# Patient Record
Sex: Female | Born: 2000 | Race: White | Hispanic: No | Marital: Single | Smoking: Current every day smoker
Health system: Southern US, Community
[De-identification: ages and names within clinical notes are randomized; demographics above are authoritative.]

## PROBLEM LIST (undated history)

## (undated) HISTORY — PX: TONSILLECTOMY: SUR1361

---
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
## 2020-08-04 IMAGING — DX DG CHEST 2V
2 series · 2 of 2 positions shown · non-contrast
Comparison: 01/13/2018.

CLINICAL DATA: Chills, myalgias and dizziness. Cough.

EXAM:
CHEST - 2 VIEW

[chest pa]
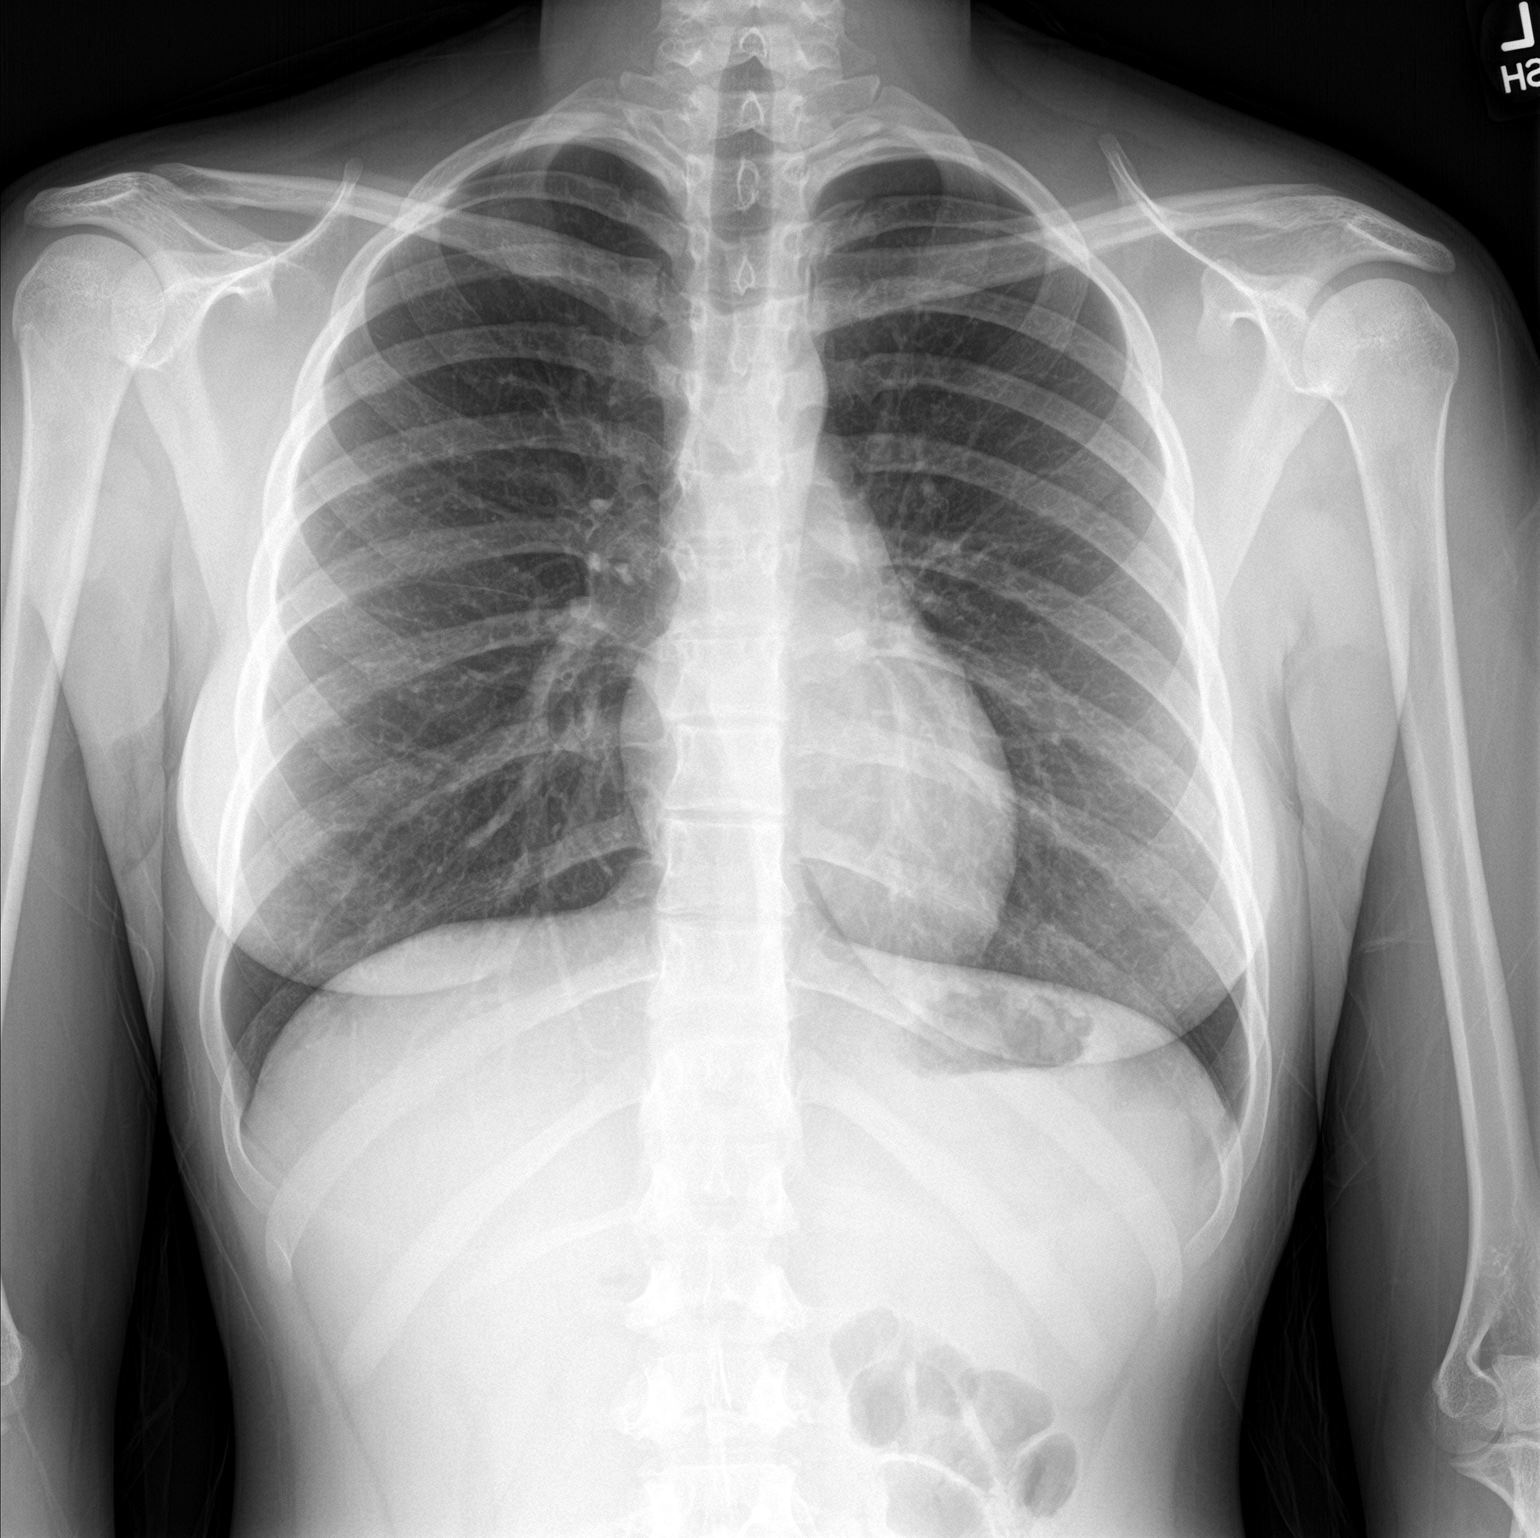

[chest lat]
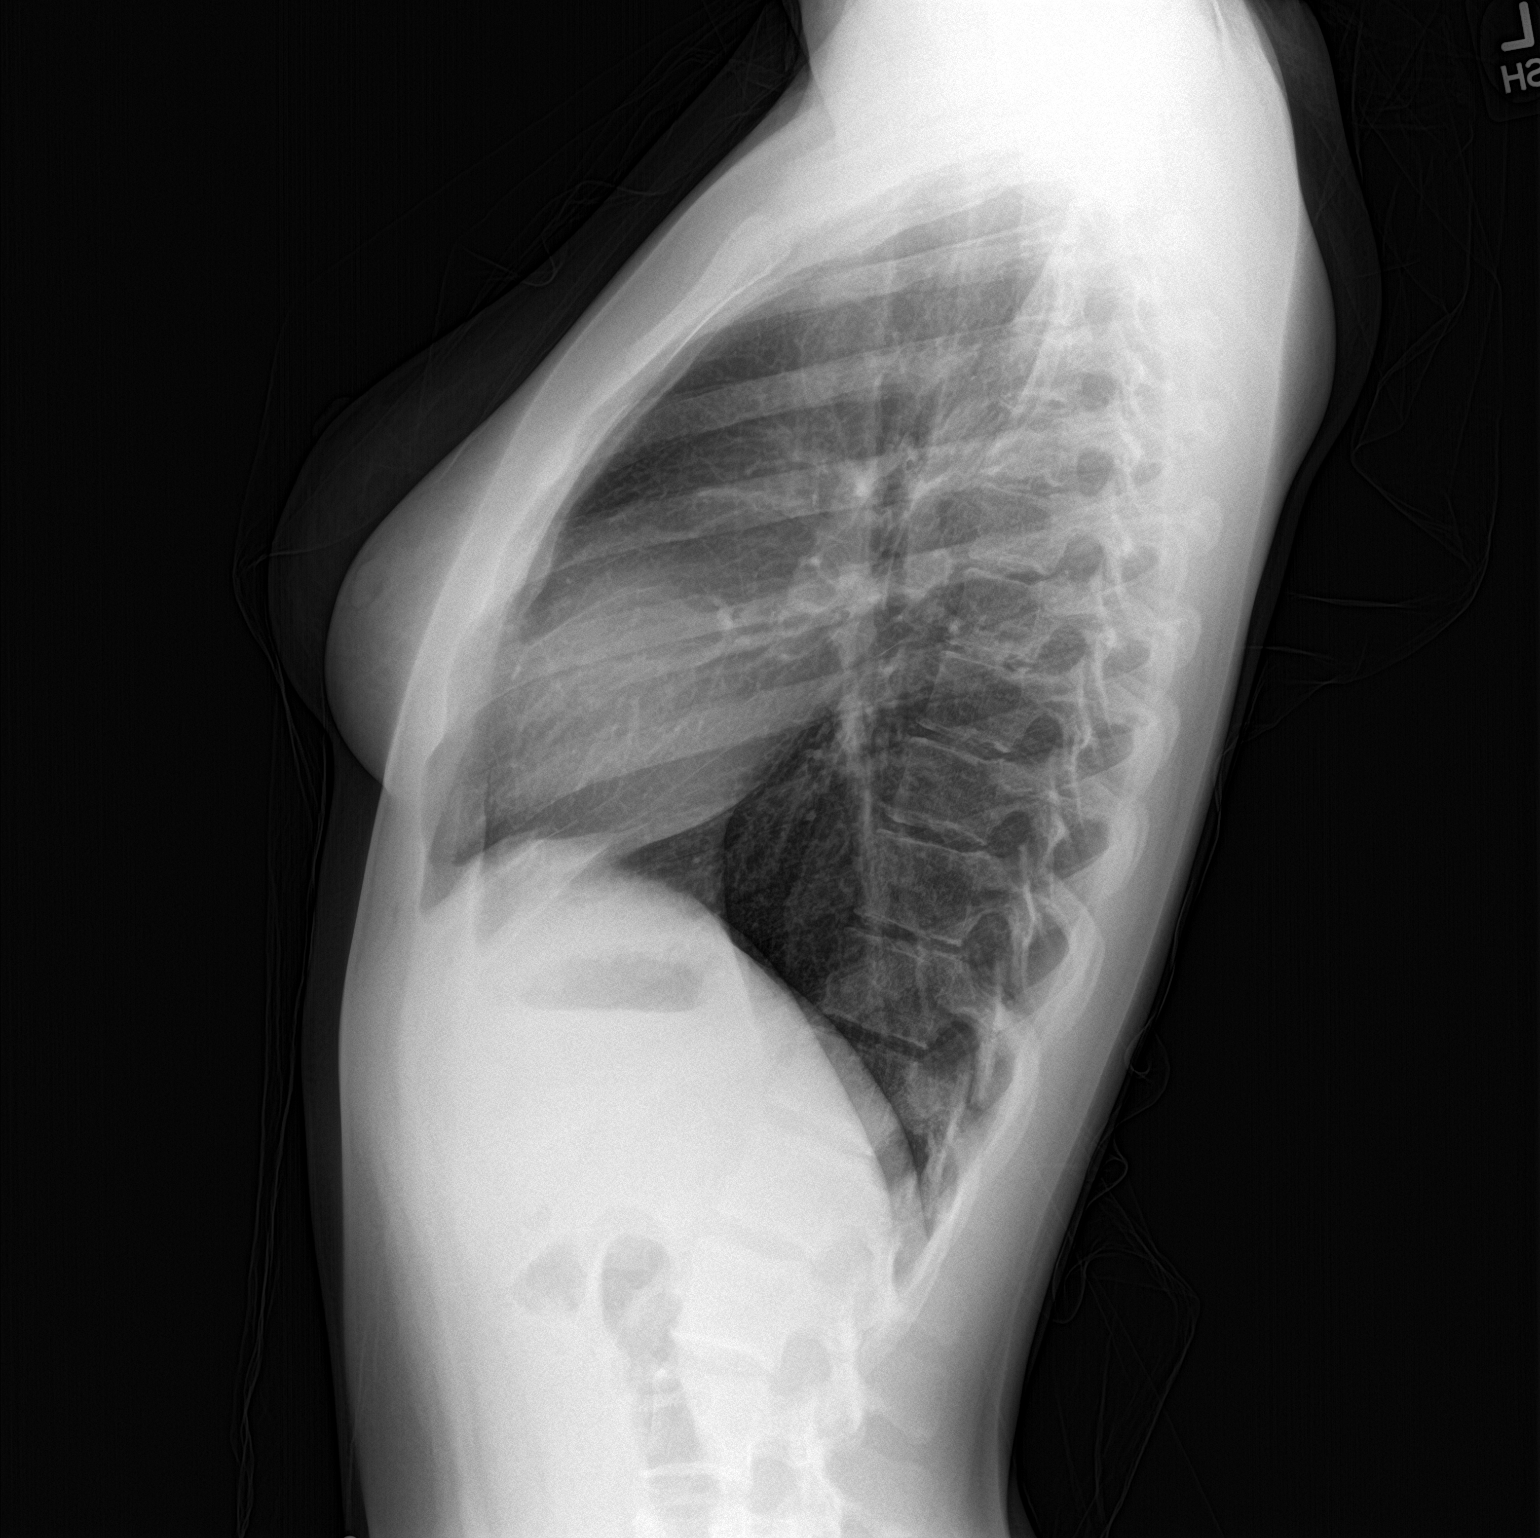

[2 of 2 positions shown; findings below may reference images not displayed]

FINDINGS: Normal sized heart. Clear lungs. Minimal peribronchial thickening
without significant change. Unremarkable bones.
IMPRESSION: Stable minimal bronchitic changes.

## 2020-08-18 ENCOUNTER — Emergency Department (HOSPITAL_BASED_OUTPATIENT_CLINIC_OR_DEPARTMENT_OTHER)
Admission: EM | Admit: 2020-08-18 | Discharge: 2020-08-18 | Disposition: A | Discharge status: Home / Self Care | Payer: Managed Care, Other (non HMO) | Attending: Emergency Medicine | Admitting: Emergency Medicine

## 2020-08-18 ENCOUNTER — Encounter (HOSPITAL_BASED_OUTPATIENT_CLINIC_OR_DEPARTMENT_OTHER): Payer: Self-pay | Admitting: *Deleted

## 2020-08-18 ENCOUNTER — Other Ambulatory Visit: Payer: Self-pay

## 2020-08-18 DIAGNOSIS — H6123 Impacted cerumen, bilateral: Secondary | ICD-10-CM | POA: Insufficient documentation

## 2020-08-18 DIAGNOSIS — F17210 Nicotine dependence, cigarettes, uncomplicated: Secondary | ICD-10-CM | POA: Insufficient documentation

## 2020-08-18 DIAGNOSIS — G8929 Other chronic pain: Secondary | ICD-10-CM | POA: Insufficient documentation

## 2020-08-18 MED ORDER — AZITHROMYCIN 250 MG PO TABS: 250.0000 mg | ORAL_TABLET | Freq: Every day | ORAL | 0 refills | Status: AC

## 2020-08-18 NOTE — ED Notes (Signed)
Pt. C/o her ears feeling full bilat after having a sore throat and sinus issues for a week.

## 2020-08-18 NOTE — Discharge Instructions (Addendum)
You may flush your ears while at home with oxide versus baby oil, as we discussed.  I have written a prescription for antibiotics to take, please take 2 tablets on day 1, 1 tablet for the next 4 days.  You will need to follow-up with an ear nose throat specialist, Dr. Berlinda Last number is attached to your chart, please schedule an appointment for further management.

## 2020-08-18 NOTE — ED Triage Notes (Signed)
Pain in both ears. She woke this am and felt like she was under water. Dizziness.

## 2020-08-18 NOTE — ED Provider Notes (Signed)
[facility] EMERGENCY DEPARTMENT Provider Note   CSN: [id number] Arrival date & time: 08/18/20  1247     History No chief complaint on file.   Natalie Gillespie is a 20 y.o. female.  20 y.o female with no past medical history presents to the ED with a chief complaint of bilateral ear pain for the past 6 months.  Patient reports the symptoms have been ongoing, she has been evaluated several times at urgent care but states "they have been unable to provide any further explanation".  States that this affects her work, she is unable to wear earplugs.  She has been given in the past eardrop, steroids without any improvement.  She has not been seen by ENT specialist.  Also reports that waking up this morning, she felt like she was likely under water all night, states she is been unable to clean out her ears.  Also reports a mild sore throat she states "might be draining from my ears ".  She does endorse bathing daily.  Also endorsing dizziness, due to feeling fullness to bilateral ears.  No nausea, vomiting, syncope, or fevers.     The history is provided by the patient.      History reviewed. No pertinent past medical history.  There are no problems to display for this patient.   Past Surgical History:  Procedure Laterality Date   TONSILLECTOMY       OB History   No obstetric history on file.     No family history on file.  Social History   Tobacco Use   Smoking status: Every Day    Pack years: 0.00    Types: Cigarettes    Passive exposure: Yes   Smokeless tobacco: Never  Vaping Use   Vaping Use: Every day  Substance Use Topics   Alcohol use: Not Currently   Drug use: Yes    Types: Marijuana    Home Medications Prior to Admission medications   Medication Sig Start Date End Date Taking? Authorizing Provider  azithromycin (ZITHROMAX Z-PAK) 250 MG tablet Take 1 tablet (250 mg total) by mouth daily. Take 2 tablets by mouth on day 1.  Then take 1 tablet by  mouth for 4 days. 08/18/20   Claude Manges, PA-C  ondansetron (ZOFRAN) 4 MG tablet Take 1 tablet (4 mg total) by mouth every 8 (eight) hours as needed for nausea or vomiting. 03/30/18   Kendrick, Caitlyn S, PA-C    Allergies    Sumatriptan  Review of Systems   Review of Systems  Constitutional:  Negative for chills and fever.  HENT:  Positive for ear pain.   Respiratory:  Negative for shortness of breath.   Cardiovascular:  Negative for chest pain.  Neurological:  Positive for dizziness.   Physical Exam Updated Vital Signs BP 127/87 (BP Location: Left Arm)   Pulse 62   Temp 98.2 F (36.8 C) (Oral)   Resp 18   Ht 5\' 3"  (1.6 m)   Wt 56.9 kg   SpO2 100%   BMI 22.23 kg/m   Physical Exam Vitals and nursing note reviewed.  Constitutional:      Appearance: Normal appearance.  HENT:     Head: Normocephalic and atraumatic.     Right Ear: Hearing normal. No swelling. There is impacted cerumen. Tympanic membrane is injected. Tympanic membrane is not perforated.     Left Ear: Hearing normal. No swelling. There is impacted cerumen. Tympanic membrane is injected. Tympanic membrane is not perforated.  Ears:     Comments: Bilateral TMs with several impacted cerumen noted.  Mild injection noted bilateral.  Hearing is normal.    Mouth/Throat:     Mouth: Mucous membranes are moist.     Comments: Mild erythema noted on the oropharynx. Eyes:     Pupils: Pupils are equal, round, and reactive to light.  Cardiovascular:     Rate and Rhythm: Normal rate.  Pulmonary:     Effort: Pulmonary effort is normal.     Breath sounds: No wheezing.     Comments: No wheezing, rhonchi, rales. Abdominal:     General: Abdomen is flat.  Musculoskeletal:     Cervical back: Normal range of motion and neck supple.  Skin:    General: Skin is warm and dry.  Neurological:     Mental Status: She is alert and oriented to person, place, and time.    ED Results / Procedures / Treatments   Labs (all labs  ordered are listed, but only abnormal results are displayed) Labs Reviewed - No data to display  EKG None  Radiology No results found.  Procedures Procedures   Medications Ordered in ED Medications - No data to display  ED Course  I have reviewed the triage vital signs and the nursing notes.  Pertinent labs & imaging results that were available during my care of the patient were reviewed by me and considered in my medical decision making (see chart for details).    MDM Rules/Calculators/A&P  Patient presents to the ED with recurrent bilateral ear pain that has been ongoing for the past 6 months.  Not seen by any specialist in the past.  States he was seen in urgent care multiple times, diagnosed with upper respiratory infection, bilaterally infection, was given steroids, eardrops, antibiotics without improvement in symptoms.  Today on evaluation she reports significant pain to bilateral ears, impacted cerumen is present to bilateral TMs, with surrounding erythema.  No palpation along the mastoid process.  Hearing is normal bilaterally.  Oropharynx is clear with prior tonsillectomy.  Lungs are clear to auscultation.  We did discuss treatment of bilateral ear infection versus impacted cerumen.  We discussed removal of cerumen while at home, with peroxide along with baby oil.  She is agreeable of this at this time.  Vitals are within normal limits, she is afebrile.    Patient is also endorsing some dizziness from her bilateral ear pain, I suspect this is likely due to significant amount of cerumen in both the ears.  We also discussed a prescription for Zithromax to help with symptomatic control.  She is agreeable of this at this time.  She is otherwise in stable condition.   Portions of this note were generated with Scientist, clinical (histocompatibility and immunogenetics). Dictation errors may occur despite best attempts at proofreading.  Final Clinical Impression(s) / ED Diagnoses Final diagnoses:  Chronic ear  pain, bilateral    Rx / DC Orders ED Discharge Orders          Ordered    azithromycin (ZITHROMAX Z-PAK) 250 MG tablet  Daily        08/18/20 1404             Claude Manges, PA-C 08/18/20 1407    Terrilee Files, MD 08/18/20 1756

## 2022-05-12 ENCOUNTER — Other Ambulatory Visit: Payer: Self-pay

## 2022-05-12 ENCOUNTER — Encounter (HOSPITAL_BASED_OUTPATIENT_CLINIC_OR_DEPARTMENT_OTHER): Payer: Self-pay | Admitting: Emergency Medicine

## 2022-05-12 ENCOUNTER — Emergency Department (HOSPITAL_BASED_OUTPATIENT_CLINIC_OR_DEPARTMENT_OTHER)
Admission: EM | Admit: 2022-05-12 | Discharge: 2022-05-12 | Disposition: A | Discharge status: Home / Self Care | Payer: Self-pay | Attending: Emergency Medicine | Admitting: Emergency Medicine

## 2022-05-12 DIAGNOSIS — R590 Localized enlarged lymph nodes: Secondary | ICD-10-CM | POA: Insufficient documentation

## 2022-05-12 DIAGNOSIS — R102 Pelvic and perineal pain: Secondary | ICD-10-CM | POA: Insufficient documentation

## 2022-05-12 LAB — WET PREP, GENITAL
Clue Cells Wet Prep HPF POC: NONE SEEN
Sperm: NONE SEEN
Trich, Wet Prep: NONE SEEN
WBC, Wet Prep HPF POC: >=10 — AB (ref <10)
Yeast Wet Prep HPF POC: NONE SEEN

## 2022-05-12 LAB — URINALYSIS, ROUTINE W REFLEX MICROSCOPIC
Bilirubin Urine: NEGATIVE
Glucose, UA: NEGATIVE mg/dL
Hgb urine dipstick: NEGATIVE
Ketones, ur: 40 mg/dL — AB
Leukocytes,Ua: NEGATIVE
Nitrite: NEGATIVE
Protein, ur: NEGATIVE mg/dL
Specific Gravity, Urine: 1.02 (ref 1.005–1.030)
pH: 6.5 (ref 5.0–8.0)

## 2022-05-12 LAB — PREGNANCY, URINE: Preg Test, Ur: NEGATIVE

## 2022-05-12 LAB — HIV ANTIBODY (ROUTINE TESTING W REFLEX): HIV Screen 4th Generation wRfx: NONREACTIVE

## 2022-05-12 MED ORDER — CEFTRIAXONE SODIUM 500 MG IJ SOLR
500.0000 mg | Freq: Once | INTRAMUSCULAR | Status: AC
  Administered 2022-05-12: 500 mg via INTRAMUSCULAR
  Filled 2022-05-12: qty 500

## 2022-05-12 MED ORDER — DOXYCYCLINE HYCLATE 100 MG PO CAPS
100.0000 mg | ORAL_CAPSULE | Freq: Two times a day (BID) | ORAL | 0 refills | Status: AC
  Filled 2022-05-12: qty 20, 10d supply, fill #0

## 2022-05-12 MED ORDER — DOXYCYCLINE HYCLATE 100 MG PO TABS
100.0000 mg | ORAL_TABLET | Freq: Once | ORAL | Status: AC
  Administered 2022-05-12: 100 mg via ORAL
  Filled 2022-05-12: qty 1

## 2022-05-12 NOTE — ED Provider Notes (Signed)
[facility] Provider Note   CSN: [id number] Arrival date & time: 05/12/22  1449     History  Chief Complaint  Patient presents with   Pelvic Pain    Natalie Gillespie is a 22 y.o. female.  The history is provided by the patient and medical records. No language interpreter was used.  Pelvic Pain     22 year old female who presents with complaint of pelvic pain.  Patient reports 8 days ago she developed pain to her lower abdomen.  States that she went to the ER for evaluation and had blood work and CT scan and was told that there was nothing acute.  Her symptoms did improve for a few days but for the past several days she noticed swelling and tenderness to her pubic region.  She does not endorse any fever chills no nausea vomiting or diarrhea no dysuria hematuria vaginal bleeding or vaginal discharge.  Last menstrual period was 2 weeks ago.  She endorsed having the same sexual partner.  She denies any postprandial pain.  She denies having any rash.  No specific treatment tried.  Home Medications Prior to Admission medications   Medication Sig Start Date End Date Taking? Authorizing Provider  azithromycin (ZITHROMAX Z-PAK) 250 MG tablet Take 1 tablet (250 mg total) by mouth daily. Take 2 tablets by mouth on day 1.  Then take 1 tablet by mouth for 4 days. 08/18/20   Janeece Fitting, PA-C  ondansetron (ZOFRAN) 4 MG tablet Take 1 tablet (4 mg total) by mouth every 8 (eight) hours as needed for nausea or vomiting. 03/30/18   Kendrick, Caitlyn S, PA-C      Allergies    Sumatriptan    Review of Systems   Review of Systems  Genitourinary:  Positive for pelvic pain.  All other systems reviewed and are negative.   Physical Exam Updated Vital Signs BP (!) 115/91   Pulse 77   Temp 98.5 F (36.9 C) (Oral)   Resp 16   Ht '5\' 2"'$  (1.575 m)   Wt 52.2 kg   LMP 05/05/2022   SpO2 100%   BMI 21.03 kg/m  Physical Exam Vitals and nursing note reviewed.   Constitutional:      General: She is not in acute distress.    Appearance: She is well-developed.  HENT:     Head: Atraumatic.  Eyes:     Conjunctiva/sclera: Conjunctivae normal.  Pulmonary:     Effort: Pulmonary effort is normal.  Abdominal:     Palpations: Abdomen is soft.     Tenderness: There is no abdominal tenderness.  Musculoskeletal:     Cervical back: Neck supple.  Skin:    Findings: No rash.  Neurological:     Mental Status: She is alert.  Psychiatric:        Mood and Affect: Mood normal.     ED Results / Procedures / Treatments   Labs (all labs ordered are listed, but only abnormal results are displayed) Labs Reviewed  WET PREP, GENITAL - Abnormal; Notable for the following components:      Result Value   WBC, Wet Prep HPF POC >=10 (*)    All other components within normal limits  URINALYSIS, ROUTINE W REFLEX MICROSCOPIC - Abnormal; Notable for the following components:   Ketones, ur 40 (*)    All other components within normal limits  PREGNANCY, URINE  RPR  HIV ANTIBODY (ROUTINE TESTING W REFLEX)  GC/CHLAMYDIA PROBE AMP ([place])  NOT AT [facility]    EKG None  Radiology No results found.  Procedures Pelvic exam  Date/Time: 05/12/2022 6:12 PM  Performed by: Domenic Moras, PA-C Authorized by: Domenic Moras, PA-C  Comments: Denyse Amass, RN, available to chaperone.  Patient does have inguinal lymphadenopathy that is tender to palpation.  No hernia noted.  Normal external genitalia.  Mild discomfort with speculum insertion.  No significant vaginal discharge or vaginal bleeding noted.  Mild friable skin noted to the T-zone.  On bimanual exam no adnexal tenderness however patient does have some cervical motion tenderness.       Medications Ordered in ED Medications  cefTRIAXone (ROCEPHIN) injection 500 mg (has no administration in time range)  doxycycline (VIBRA-TABS) tablet 100 mg (has no administration in time range)    ED Course/ Medical Decision Making/  A&P                             Medical Decision Making Amount and/or Complexity of Data Reviewed Labs: ordered.  Risk Prescription drug management.   BP (!) 115/91   Pulse 77   Temp 98.5 F (36.9 C) (Oral)   Resp 16   Ht '5\' 2"'$  (1.575 m)   Wt 52.2 kg   LMP 05/05/2022   SpO2 100%   BMI 21.03 kg/m   41:63 PM 22 year old female who presents with complaint of pelvic pain.  Patient reports 8 days ago she developed pain to her lower abdomen.  States that she went to the ER for evaluation and had blood work and CT scan and was told that there was nothing acute.  Her symptoms did improve for a few days but for the past several days she noticed swelling and tenderness to her pubic region.  She does not endorse any fever chills no nausea vomiting or diarrhea no dysuria hematuria vaginal bleeding or vaginal discharge.  Last menstrual period was 2 weeks ago.  She endorsed having the same sexual partner.  She denies any postprandial pain.  She denies having any rash.  No specific treatment tried.  On exam this is a well-appearing female resting comfortably in bed appears to be in no acute discomfort.  Heart with normal rate and rhythm, lungs are clear to auscultation bilaterally abdomen soft nontender  -Labs ordered, independently viewed and interpreted by me.  Labs remarkable for normal wet prep, UA without UTI, preg test negative -The patient was maintained on a cardiac monitor.  I personally viewed and interpreted the cardiac monitored which showed an underlying rhythm of: NSR -Imaging including abd/pelvis CT considered but not performed as abd exam without any significant tenderness.  PT recently had CT of abd/pelvis as well.  EMR reviewed -This patient presents to the ED for concern of pelvic pain, this involves an extensive number of treatment options, and is a complaint that carries with it a high risk of complications and morbidity.  The differential diagnosis includes lymphadenopathy, STI,  cervicitis, pid, ectopic pregnancy, ovarian torsion, TOA -Co morbidities that complicate the patient evaluation includes none -Treatment includes rocephin, doxycycline -Reevaluation of the patient after these medicines showed that the patient improved -PCP office notes or outside notes reviewed -Escalation to admission/observation considered: patients feels much better, is comfortable with discharge, and will follow up with PCP -Prescription medication considered, patient comfortable with doxycycline -Social Determinant of Health considered         Final Clinical Impression(s) / ED Diagnoses Final diagnoses:  Inguinal lymphadenopathy  Rx / DC Orders ED Discharge Orders          Ordered    doxycycline (VIBRAMYCIN) 100 MG capsule  2 times daily        05/12/22 1835              Domenic Moras, PA-C 05/12/22 [place], [place], DO 05/12/22 [phone]

## 2022-05-12 NOTE — ED Triage Notes (Signed)
Pt c/o a knot to bil sides of pelvis; sts they are painful, but not red; heavy feeling in pelvic area; denies vaginal bleeding/discharge

## 2022-05-12 NOTE — Discharge Instructions (Addendum)
You have several large lymph nodes in your inguinal region likely causing pain.  Sometimes this may be due to infection.  Please take antibiotic as prescribed.  Use MyChart, link below, check the results of the rest of your blood work.  If you test positive for any specific infection, we will contact you to notify.  Take ibuprofen as needed for pain

## 2022-05-13 ENCOUNTER — Other Ambulatory Visit (HOSPITAL_BASED_OUTPATIENT_CLINIC_OR_DEPARTMENT_OTHER): Payer: Self-pay

## 2022-05-13 LAB — GC/CHLAMYDIA PROBE AMP (~~LOC~~) NOT AT ARMC
Chlamydia: NEGATIVE
Comment: NEGATIVE
Comment: NORMAL
Neisseria Gonorrhea: NEGATIVE

## 2022-05-13 LAB — RPR: RPR Ser Ql: NONREACTIVE
